# Patient Record
Sex: Female | Born: 1955 | Hispanic: Yes | Marital: Single | Smoking: Never smoker
Health system: Southern US, Community
[De-identification: ages and names within clinical notes are randomized; demographics above are authoritative.]

---

## 2006-10-05 ENCOUNTER — Ambulatory Visit: Payer: Self-pay

## 2006-10-08 ENCOUNTER — Ambulatory Visit: Payer: Self-pay

## 2008-05-17 ENCOUNTER — Emergency Department: Payer: Self-pay | Admitting: Emergency Medicine

## 2010-01-15 ENCOUNTER — Ambulatory Visit: Payer: Self-pay | Admitting: Family

## 2010-01-29 ENCOUNTER — Ambulatory Visit: Payer: Self-pay | Admitting: Family Medicine

## 2012-03-02 ENCOUNTER — Ambulatory Visit: Payer: Self-pay

## 2016-01-27 ENCOUNTER — Emergency Department: Payer: Self-pay

## 2016-01-27 ENCOUNTER — Encounter: Payer: Self-pay | Admitting: Emergency Medicine

## 2016-01-27 ENCOUNTER — Emergency Department
Admission: EM | Admit: 2016-01-27 | Discharge: 2016-01-27 | Disposition: A | Discharge status: Home / Self Care | Payer: Self-pay | Attending: Emergency Medicine | Admitting: Emergency Medicine

## 2016-01-27 DIAGNOSIS — R079 Chest pain, unspecified: Secondary | ICD-10-CM

## 2016-01-27 DIAGNOSIS — R0789 Other chest pain: Secondary | ICD-10-CM | POA: Insufficient documentation

## 2016-01-27 LAB — COMPREHENSIVE METABOLIC PANEL
ALK PHOS: 87 U/L (ref 38–126)
ALT: 30 U/L (ref 14–54)
ANION GAP: 6 (ref 5–15)
AST: 19 U/L (ref 15–41)
Albumin: 4.2 g/dL (ref 3.5–5.0)
BUN: 22 mg/dL — AB (ref 6–20)
CALCIUM: 8.8 mg/dL — AB (ref 8.9–10.3)
CO2: 25 mmol/L (ref 22–32)
CREATININE: 0.73 mg/dL (ref 0.44–1.00)
Chloride: 107 mmol/L (ref 101–111)
GFR calc Af Amer: >60 mL/min (ref >60)
GFR calc non Af Amer: >60 mL/min (ref >60)
GLUCOSE: 99 mg/dL (ref 65–99)
Potassium: 3.6 mmol/L (ref 3.5–5.1)
Sodium: 138 mmol/L (ref 135–145)
Total Bilirubin: 1.2 mg/dL (ref 0.3–1.2)
Total Protein: 7.6 g/dL (ref 6.5–8.1)

## 2016-01-27 LAB — CBC WITH DIFFERENTIAL/PLATELET
Basophils Absolute: 0 10*3/uL (ref 0–0.1)
Basophils Relative: 1 %
EOS PCT: 2 %
Eosinophils Absolute: 0.2 10*3/uL (ref 0–0.7)
HCT: 40.1 % (ref 35.0–47.0)
Hemoglobin: 13.8 g/dL (ref 12.0–16.0)
LYMPHS ABS: 2.7 10*3/uL (ref 1.0–3.6)
LYMPHS PCT: 36 %
MCH: 30 pg (ref 26.0–34.0)
MCHC: 34.5 g/dL (ref 32.0–36.0)
MCV: 87.1 fL (ref 80.0–100.0)
MONO ABS: 0.4 10*3/uL (ref 0.2–0.9)
MONOS PCT: 6 %
Neutro Abs: 4.3 10*3/uL (ref 1.4–6.5)
Neutrophils Relative %: 55 %
PLATELETS: 253 10*3/uL (ref 150–440)
RBC: 4.6 MIL/uL (ref 3.80–5.20)
RDW: 13.2 % (ref 11.5–14.5)
WBC: 7.7 10*3/uL (ref 3.6–11.0)

## 2016-01-27 LAB — FIBRIN DERIVATIVES D-DIMER (ARMC ONLY): Fibrin derivatives D-dimer (ARMC): 783 — ABNORMAL HIGH (ref 0–499)

## 2016-01-27 LAB — TROPONIN I: Troponin I: <0.03 ng/mL (ref <0.03)

## 2016-01-27 MED ORDER — IOPAMIDOL (ISOVUE-370) INJECTION 76%
75.0000 mL | Freq: Once | INTRAVENOUS | Status: AC | PRN
  Administered 2016-01-27: 75 mL via INTRAVENOUS

## 2016-01-27 MED ORDER — ASPIRIN 81 MG PO CHEW
324.0000 mg | CHEWABLE_TABLET | Freq: Once | ORAL | Status: AC
  Administered 2016-01-27: 324 mg via ORAL
  Filled 2016-01-27: qty 4

## 2016-01-27 MED ORDER — IBUPROFEN 600 MG PO TABS
600.0000 mg | ORAL_TABLET | ORAL | Status: AC
  Administered 2016-01-27: 600 mg via ORAL
  Filled 2016-01-27: qty 1

## 2016-01-27 NOTE — ED Triage Notes (Signed)
C/o headache and chest pain x 1 wk.  Rad into left arm.  Denies sob or nausea.  nothing makes pain worse of better.

## 2016-01-27 NOTE — ED Provider Notes (Signed)
[facility] Emergency Department Provider Note   ____________________________________________   First MD Initiated Contact with Patient 01/27/16 1607     (approximate)  I have reviewed the triage vital signs and the nursing notes.   HISTORY  Chief Complaint Chest Pain and Headache  History and physical obtained with Spanish interpreter  HPI Ana CalRosalia Riley KillRodriguez Harris is a 60 y.o. female presents for evaluation of left-sided pressure in the chest since been persistent for about a week.  Patient reports a constant feeling of slight pressure sensation across left chest. 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  No fevers chills or shortness of breath. No personal history of heart disease. Denies early onset heart disease in the family. She does not smoke. Pain is not radiating. She does report chronic back pain after an injury a year ago which is unchanged   History reviewed. No pertinent past medical history.  There are no active problems to display for this patient.   Past Surgical History:  Procedure Laterality Date  . CESAREAN SECTION     x2    Prior to Admission medications   Not on File    Allergies Review of patient's allergies indicates no known allergies.  No family history on file.  Social History Social History  Substance Use Topics  . Smoking status: Never Smoker  . Smokeless tobacco: Never Used  . Alcohol use Not on file    Review of Systems Constitutional: No fever/chills Eyes: No visual changes. ENT: No sore throat. Cardiovascular: See history of present illness Respiratory: Denies shortness of breath. Gastrointestinal: No abdominal pain.  No nausea, no vomiting.  No diarrhea.  No constipation. Genitourinary: Negative for dysuria. Musculoskeletal: See history of present illness Skin: Negative for rash. Neurological: Negative for headaches, focal weakness or  numbness.  10-point ROS otherwise negative.  ____________________________________________   PHYSICAL EXAM:  VITAL SIGNS: ED Triage Vitals [01/27/16 1557]  Enc Vitals Group     BP (!) 150/73     Pulse Rate 60     Resp 18     Temp 98.3 F (36.8 C)     Temp Source Oral     SpO2 100 %     Weight 190 lb (86.2 kg)     Height 5\' 5"  (1.651 m)     Head Circumference      Peak Flow      Pain Score      Pain Loc      Pain Edu?      Excl. in GC?     Constitutional: Alert and oriented. Well appearing and in no acute distress. Eyes: Conjunctivae are normal. PERRL. EOMI. Head: Atraumatic. Nose: No congestion/rhinnorhea. Mouth/Throat: Mucous membranes are moist. Neck: No stridor.   Cardiovascular: Normal rate, regular rhythm. Grossly normal heart sounds.  Good peripheral circulation. Respiratory: Normal respiratory effort.  No retractions. Lungs CTAB. Gastrointestinal: Soft and nontender. No distention. Musculoskeletal: No lower extremity tenderness nor edema.  No joint effusions.Patient reports worsening discomfort in her chest with palpation across the left upper chest wall, also worsened by lifting her left arm in abduction. Neurovascular intact upper extremities bilateral. No motor deficits. Neurologic:  Normal speech and language. No gross focal neurologic deficits are appreciated. Skin:  Skin is warm, dry and intact. No rash noted. Psychiatric: Mood and affect are normal. Speech and behavior are normal.  ____________________________________________   LABS (all labs ordered are listed, but only abnormal results are displayed)  Labs Reviewed  COMPREHENSIVE METABOLIC PANEL - Abnormal; Notable for the following:       Result Value   BUN 22 (*)    Calcium 8.8 (*)    All other components within normal limits  FIBRIN DERIVATIVES D-DIMER ([facility] ONLY) - Abnormal; Notable for the following:    Fibrin derivatives D-dimer ([facility]) 783 (*)    All other components within normal limits   CBC WITH DIFFERENTIAL/PLATELET  TROPONIN I  TROPONIN I   ____________________________________________  EKG  ED ECG REPORT I, QUALE, MARK, the attending physician, personally viewed and interpreted this ECG.  Date: 01/27/2016 EKG Time: 1556 Rate: 65 Rhythm: normal sinus rhythm QRS Axis: normal Intervals: normal ST/T Wave abnormalities: normal Conduction Disturbances: none Narrative Interpretation: unremarkable  ____________________________________________  RADIOLOGY  Dg Chest 2 View  Result Date: 01/27/2016 CLINICAL DATA:  Headache and chest pain for 1 week. EXAM: CHEST  2 VIEW COMPARISON:  Cervical spine 05/17/2008 FINDINGS: Prominent lung markings are probably related to chronic changes. No focal airspace disease or pulmonary edema. Heart size is within normal limits. No pleural effusions. The trachea is midline. Mild widening of the upper mediastinum appears similar to the prior cervical spine radiograph. IMPRESSION: No active cardiopulmonary disease. Electronically Signed   By: Richarda Overlie M.D.   On: 01/27/2016 17:08   Ct Angio Chest Pe W Or Wo Contrast  Result Date: 01/27/2016 CLINICAL DATA:  Headaches and chest pain for 1 week EXAM: CT ANGIOGRAPHY CHEST WITH CONTRAST TECHNIQUE: Multidetector CT imaging of the chest was performed using the standard protocol during bolus administration of intravenous contrast. Multiplanar CT image reconstructions and MIPs were obtained to evaluate the vascular anatomy. CONTRAST:  75 mL Isovue 370. COMPARISON:  Plain film from earlier in the same day FINDINGS: Cardiovascular: Mild atherosclerotic calcifications of the aorta are noted. No significant aneurysmal dilatation or dissection is identified. The pulmonary artery demonstrates a normal branching pattern. No filling defect to suggest pulmonary embolism is identified. No significant coronary calcifications are seen. Cardiac structures as visualized are within normal limits. Mediastinum/Nodes:  Multiple calcified hilar lymph nodes are noted consistent with prior granulomatous disease. No significant lymphadenopathy is identified. No axillary adenopathy is seen. Thoracic inlet is within normal limits. Lungs/Pleura: Lungs are well aerated bilaterally. No focal infiltrate or sizable effusion is seen. Upper Abdomen: The upper abdomen is within normal limits with the exception of a small myelolipoma in the right adrenal gland. Musculoskeletal: Degenerative changes of the thoracic spine are noted. Review of the MIP images confirms the above findings. IMPRESSION: No evidence of pulmonary emboli. Mild aortic atherosclerotic disease. Right adrenal myelolipoma. Changes of prior granulomatous disease. Electronically Signed   By: Alcide Clever M.D.   On: 01/27/2016 20:12    ____________________________________________   PROCEDURES  Procedure(s) performed: None  Procedures  Critical Care performed: No  ____________________________________________   INITIAL IMPRESSION / ASSESSMENT AND PLAN / ED COURSE  Pertinent labs & imaging results that were available during my care of the patient were reviewed by me and considered in my medical decision making (see chart for details).  Patient evidently low risk for acute coronary syndrome by heart score. Discussed with Dr. Park Breed of cardiology, patient agreeable and given an appointment at 10 AM tomorrow. Likely musculoskeletal in nature, given patient's age she will follow up closely with Dr. Park Breed. Atypical symptoms given the length of symptomatology, reassuring EKG and negative troponins. D-dimer was slightly elevated, CT and GU negative for pulmonary embolism or dissection.  Appears appropriate for  ongoing outpatient workup.   Return precautions and treatment recommendations and follow-up discussed with the patient who is agreeable with the plan.  Discharge and follow-up plans discussed with patient via Spanish interpreter  Clinical Course      ____________________________________________   FINAL CLINICAL IMPRESSION(S) / ED DIAGNOSES  Final diagnoses:  Left sided chest pain  Chest pain with low risk for cardiac etiology      NEW MEDICATIONS STARTED DURING THIS VISIT:  There are no discharge medications for this patient.    Note:  This document was prepared using Dragon voice recognition software and may include unintentional dictation errors.    Sharyn Creamer, MD 01/28/16 [phone]

## 2016-01-27 NOTE — Discharge Instructions (Signed)

## 2017-05-06 IMAGING — CT CT ANGIO CHEST
2 of 6 series · 18 of 46 positions shown · IV contrast (APPLIED)
Comparison: Plain film from earlier in the same day

CLINICAL DATA: Headaches and chest pain for 1 week

EXAM:
CT ANGIOGRAPHY CHEST WITH CONTRAST
TECHNIQUE: Multidetector CT imaging of the chest was performed using the
standard protocol during bolus administration of intravenous
contrast. Multiplanar CT image reconstructions and MIPs were
obtained to evaluate the vascular anatomy.
CONTRAST:  75 mL Isovue 370.

[Series 5: thins · axial · 0.76mm/px · z∈[-731,-455]mm · 16 of 304 slices shown]
[im 14/304  lung]
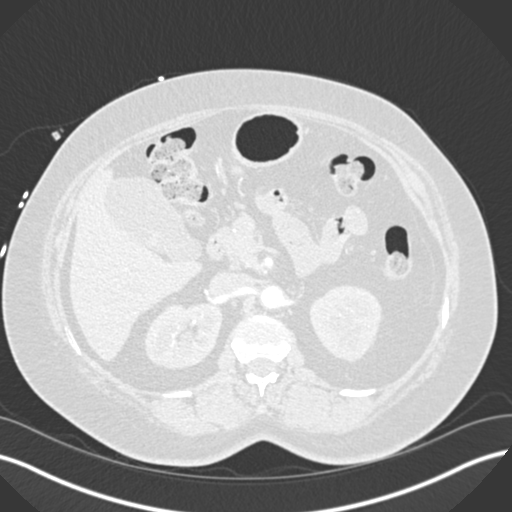
[im 40/304  soft-tissue]
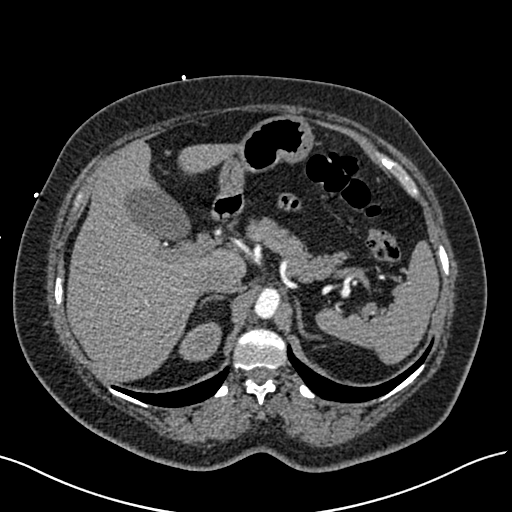
[im 53/304  lung]
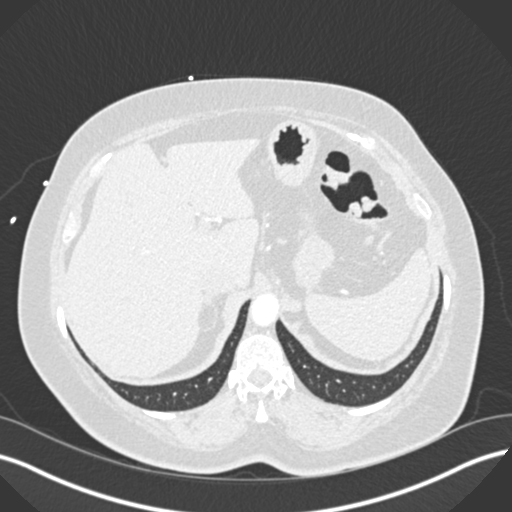
[im 66/304  soft-tissue]
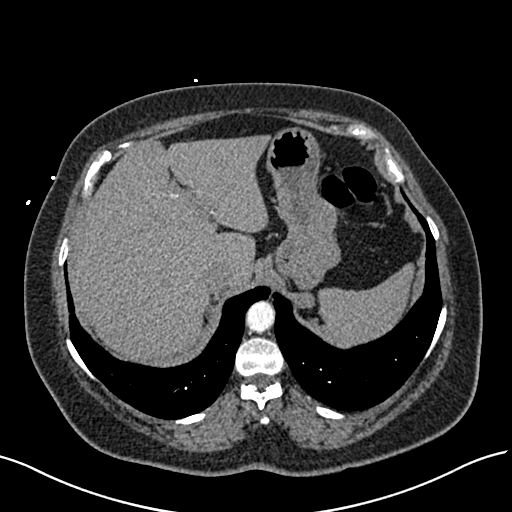
[im 93/304  lung]
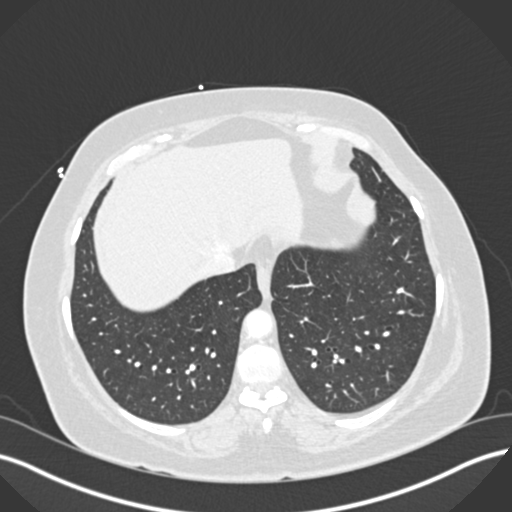
[im 106/304  soft-tissue]
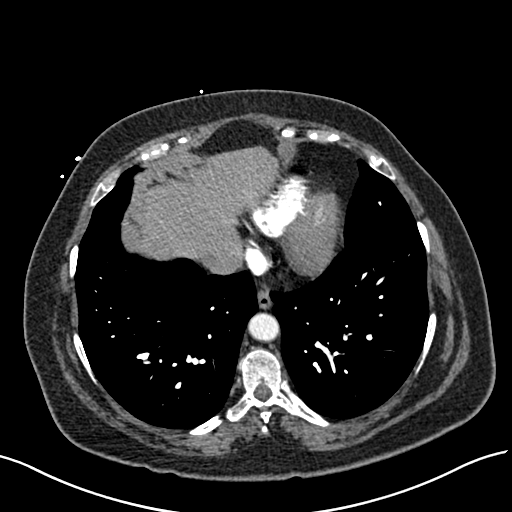
[im 119/304  lung]
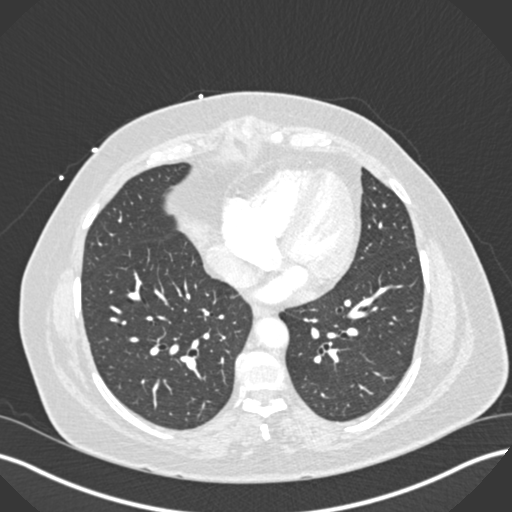
[im 145/304  soft-tissue]
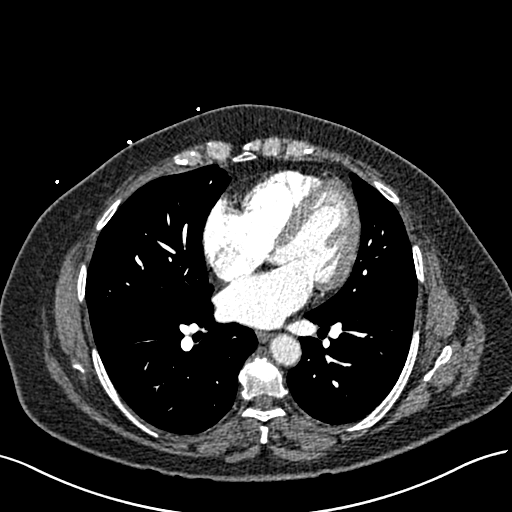
[im 159/304  lung]
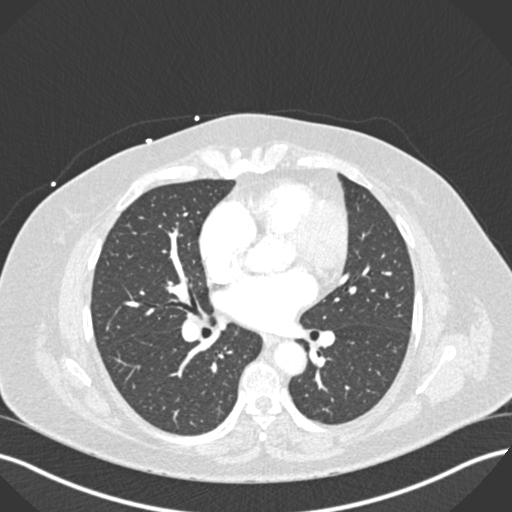
[im 185/304  soft-tissue]
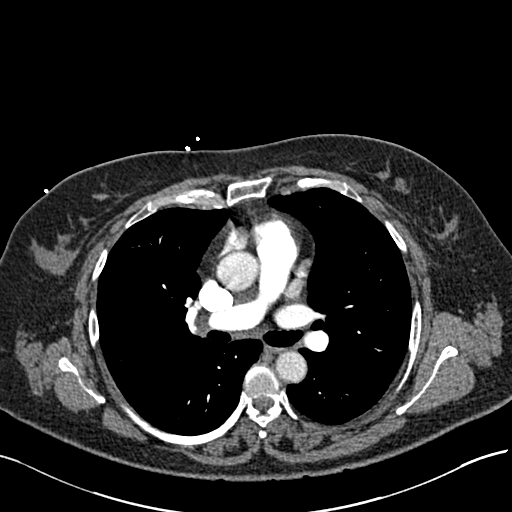
[im 198/304  lung]
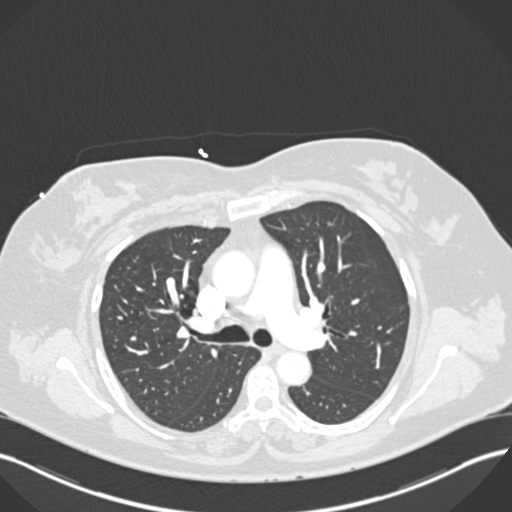
[im 211/304  soft-tissue]
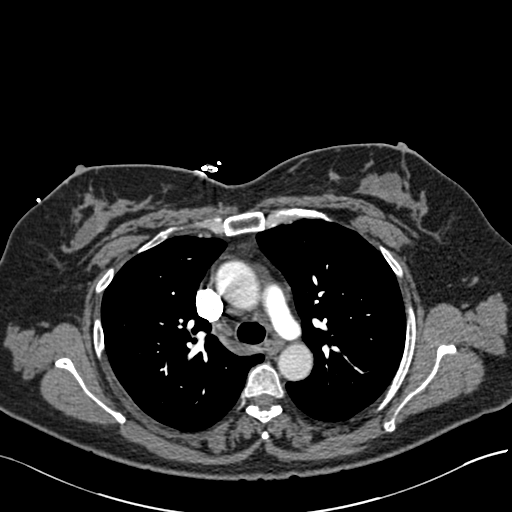
[im 238/304  lung]
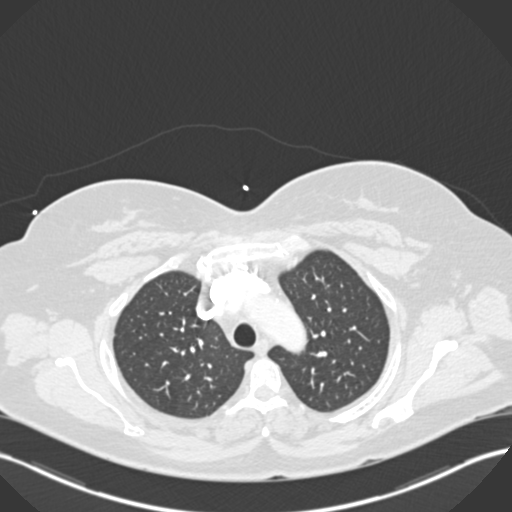
[im 251/304  soft-tissue]
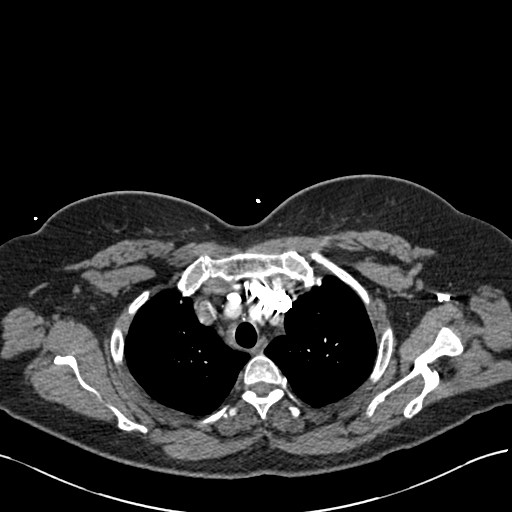
[im 264/304  lung]
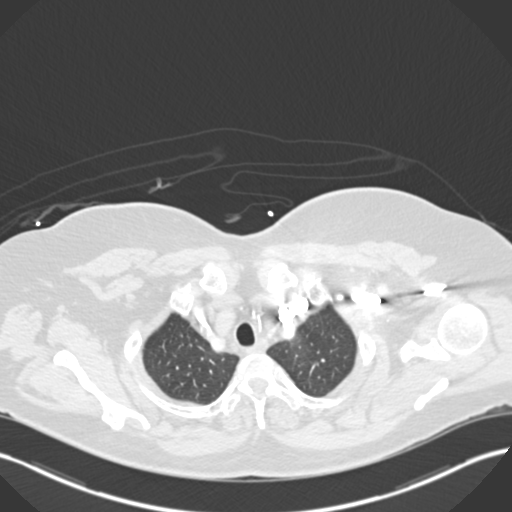
[im 290/304  soft-tissue]
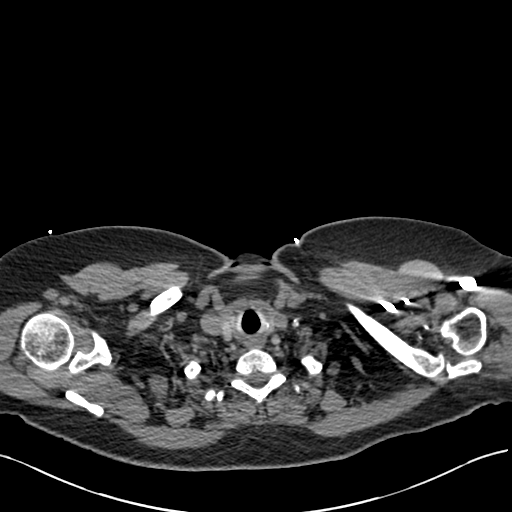

[Series 7: coronal mpr · coronal · 0.60mm/px · 2 of 92 slices shown]
[im 31/92  soft-tissue]
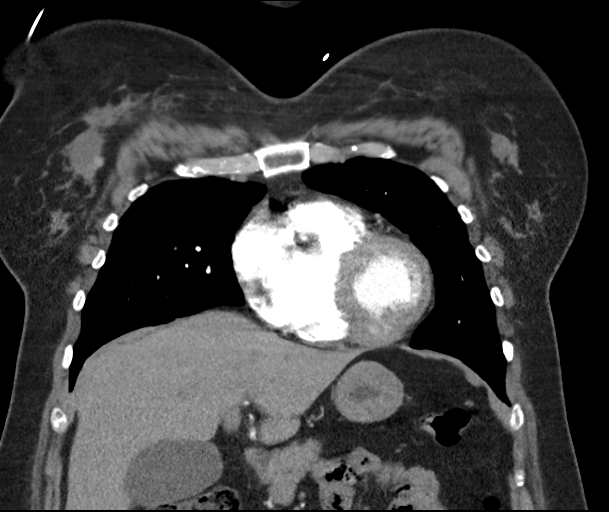
[im 61/92  soft-tissue]
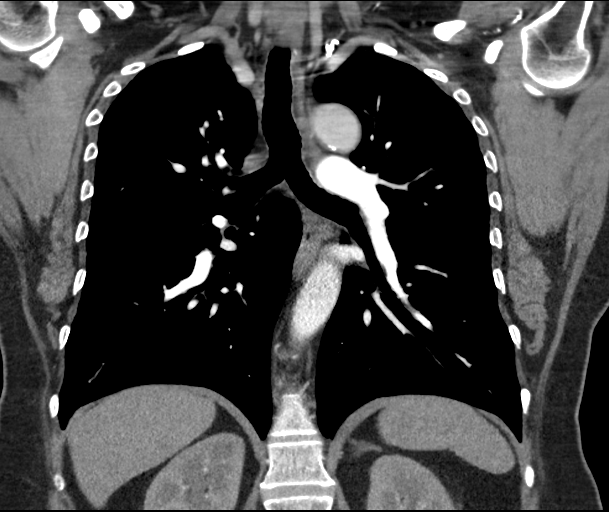

[18 of 46 positions shown; findings below may reference images not displayed]

FINDINGS: Cardiovascular: Mild atherosclerotic calcifications of the aorta are
noted. No significant aneurysmal dilatation or dissection is
identified. The pulmonary artery demonstrates a normal branching
pattern. No filling defect to suggest pulmonary embolism is
identified. No significant coronary calcifications are seen. Cardiac
structures as visualized are within normal limits.

Mediastinum/Nodes: Multiple calcified hilar lymph nodes are noted
consistent with prior granulomatous disease. No significant
lymphadenopathy is identified. No axillary adenopathy is seen.
Thoracic inlet is within normal limits.

Lungs/Pleura: Lungs are well aerated bilaterally. No focal
infiltrate or sizable effusion is seen.

Upper Abdomen: The upper abdomen is within normal limits with the
exception of a small myelolipoma in the right adrenal gland.

Musculoskeletal: Degenerative changes of the thoracic spine are
noted.

Review of the MIP images confirms the above findings.
IMPRESSION: No evidence of pulmonary emboli.

Mild aortic atherosclerotic disease.

Right adrenal myelolipoma.

Changes of prior granulomatous disease.

## 2017-05-06 IMAGING — CR DG CHEST 2V
2 series · 3 of 3 positions shown · non-contrast
Comparison: Cervical spine 05/17/2008

CLINICAL DATA: Headache and chest pain for 1 week.

EXAM:
CHEST  2 VIEW

[chest pa]
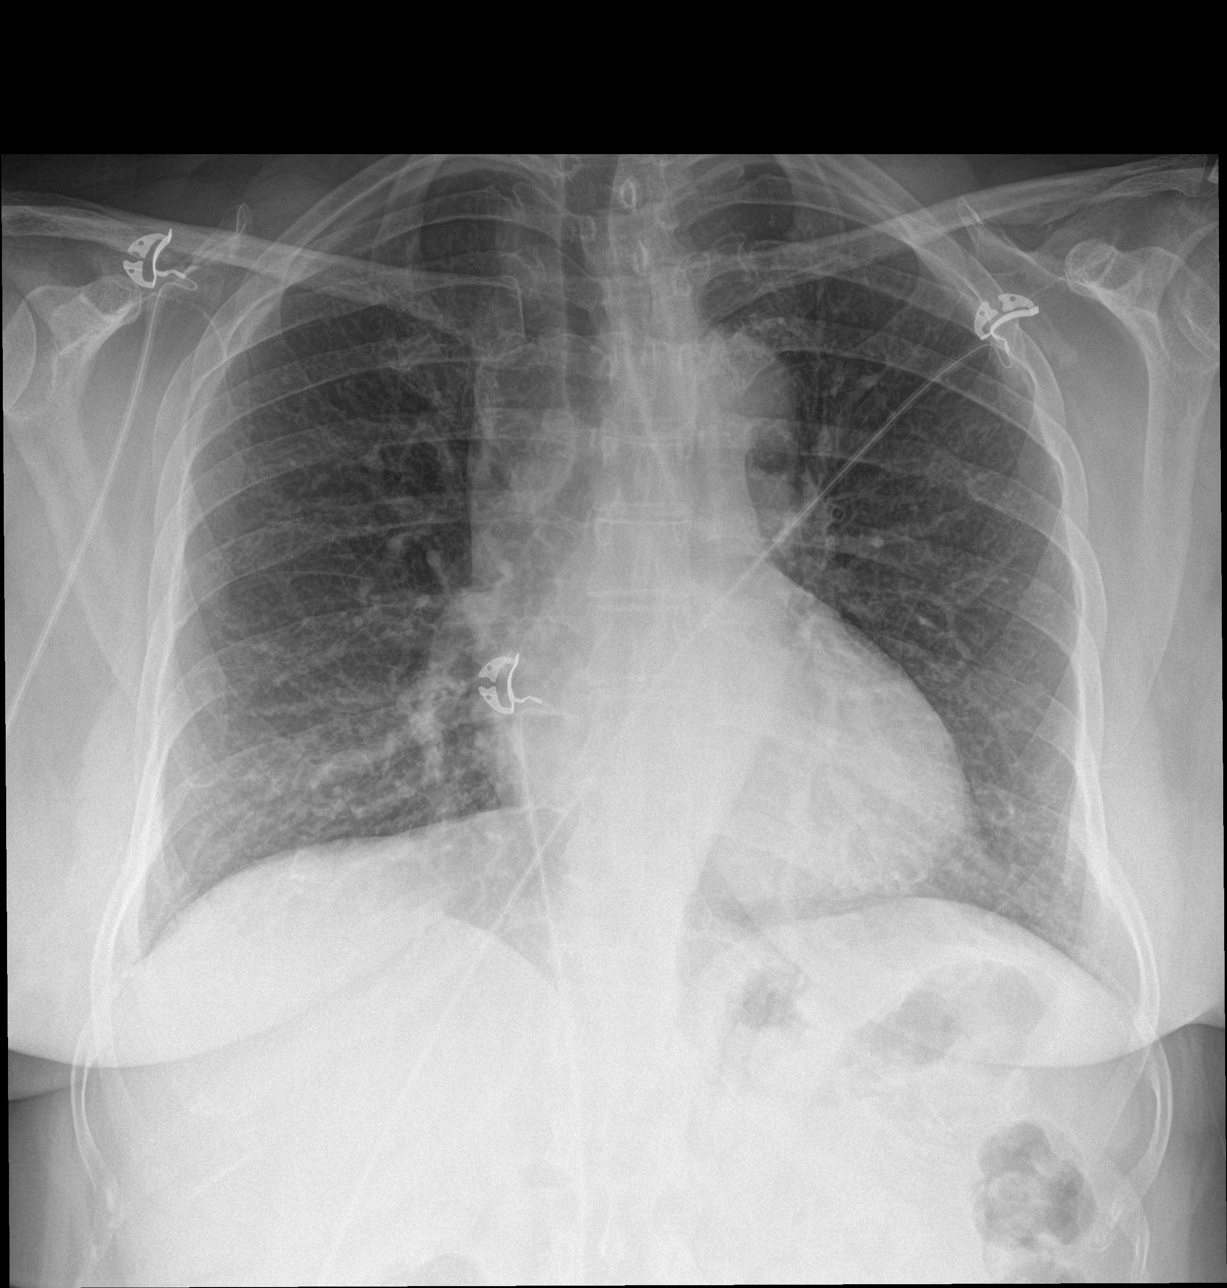

[Series 2: chest lat · 0.14mm/px · 2 of 2 slices shown]
[im 1/2]
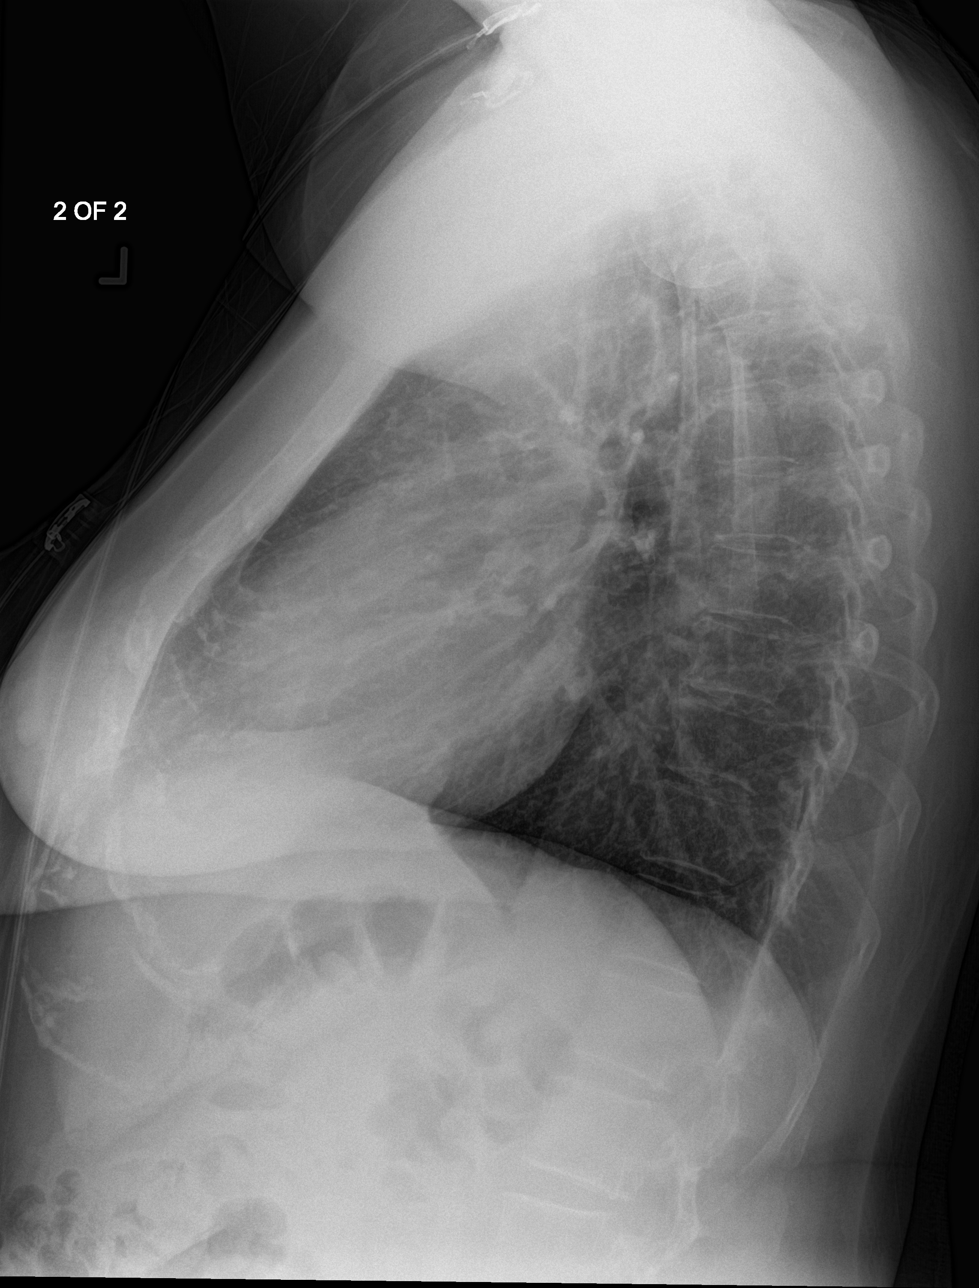
[im 2/2]
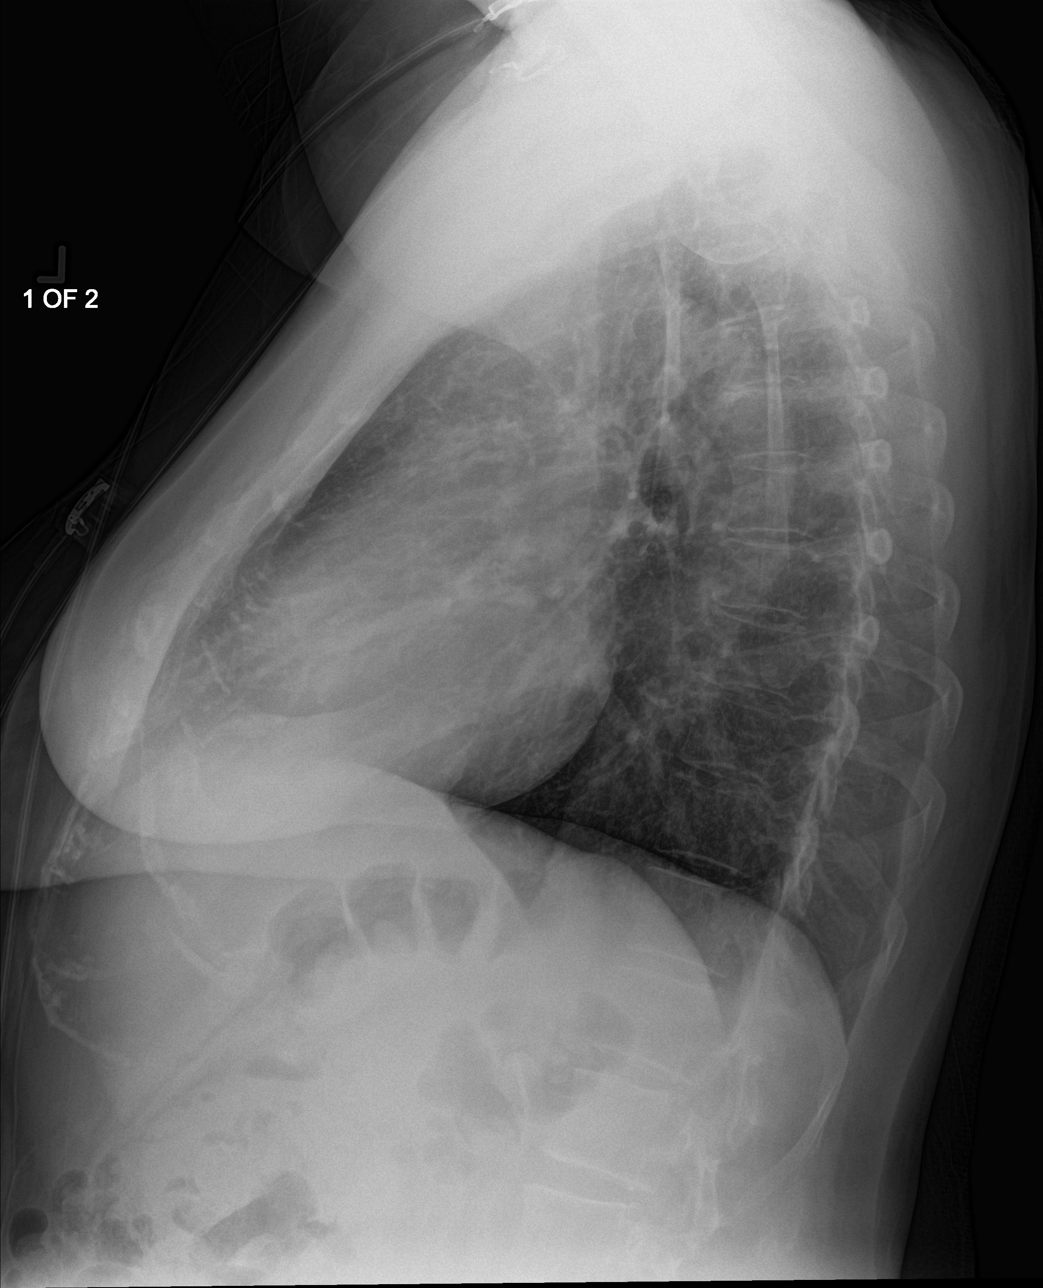

[3 of 3 positions shown; findings below may reference images not displayed]

FINDINGS: Prominent lung markings are probably related to chronic changes. No
focal airspace disease or pulmonary edema. Heart size is within
normal limits. No pleural effusions. The trachea is midline. Mild
widening of the upper mediastinum appears similar to the prior
cervical spine radiograph.
IMPRESSION: No active cardiopulmonary disease.
